# Patient Record
Sex: Male | Born: 1990 | Race: Black or African American | Hispanic: No | Marital: Single | Smoking: Current every day smoker
Health system: Southern US, Community
[De-identification: ages and names within clinical notes are randomized; demographics above are authoritative.]

## PROBLEM LIST (undated history)

## (undated) DIAGNOSIS — I10 Essential (primary) hypertension: Secondary | ICD-10-CM

---
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
## 2008-05-20 ENCOUNTER — Ambulatory Visit (HOSPITAL_BASED_OUTPATIENT_CLINIC_OR_DEPARTMENT_OTHER): Admission: RE | Admit: 2008-05-20 | Discharge: 2008-05-20 | Payer: Self-pay | Admitting: Internal Medicine

## 2008-05-23 ENCOUNTER — Ambulatory Visit: Payer: Self-pay | Admitting: Internal Medicine

## 2010-06-28 NOTE — Procedures (Signed)
NAME:  Raymond Warren, Raymond Warren                ACCOUNT NO.:  [id number]   MEDICAL RECORD NO.:  [id number]          PATIENT TYPE:  OUT   LOCATION:  SLEEP CENTER                 FACILITY:  [facility]   PHYSICIAN:  Clinton D. Maple Hudson, MD, FCCP, FACPDATE OF BIRTH:  06-23-88   DATE OF STUDY:  05/20/2008                            NOCTURNAL POLYSOMNOGRAM   REFERRING PHYSICIAN:  Fleet Contras, M.D.   INDICATIONS FOR STUDY:  Hypersomnia with sleep apnea.   EPWORTH SLEEPINESS SCORE:  Epworth sleepiness score 17/24.  BMI 46.4.  Weight 305 pounds.  Height 68 inches.  Neck 17.5 inches.   HOME MEDICATIONS:  Charted and reviewed.   SLEEP ARCHITECTURE:  Total sleep time 367 minutes with sleep efficiency  84.6%.  Stage I was 6.5%, stage II 70.4%, stage III absent, and REM 23%  of total sleep time.  Sleep latency 23 minutes, REM latency 68 minutes,  awake after sleep onset 44 minutes, and arousal index 18.6.  No bedtime  medication was taken.   RESPIRATORY DATA:  Apnea/hypopnea index (AHI) 3.1 per hour.  A total of  19 events was scored including 5 obstructive apneas and 14 hypopneas.  Events were all associated with supine sleep position.  REM AHI 6.4 per  hour.  An additional 17 respiratory events related to arousal, but not  meeting duration and oxygen desaturation criteria to be counted as  apneas or hypopneas contributed to an overall respiratory disturbance  index (RDI) 5.9 per hour.   OXYGEN DATA:  Loud snoring with oxygen desaturation to a nadir of 84%.  Mean oxygen saturation through the study was 95.4% on room air.   CARDIAC DATA:  Normal sinus rhythm.   MOVEMENT/PARASOMNIA:  No significant movement disturbance.  Bathroom x1.   IMPRESSION/RECOMMENDATIONS:  1. Occasional respiratory events with sleep disturbance, AHI 3.1 per      hour (normal range 0-5 per hour).  Additional respiratory events      related to arousal and rates the total respiratory disturbance      index to 5.9 per hour with  discussion as above.  This would be      minimal criteria for very mild obstructive sleep apnea/hypopnea      syndrome.  Events were associated with supine sleep position, very      loud snoring, and oxygen desaturation to a nadir of 84% on room      air.  2. Scores in this range are not usually addressed with CPAP therapy.      His events were positional and he may do      better with encouragement to sleep off flat of his back and perhaps      with treatment for nasal congestion or upper airway obstruction if      appropriate.      Clinton D. Maple Hudson, MD, [facility], FACP  Diplomate, Biomedical engineer of Sleep Medicine  Electronically Signed     CDY/MEDQ  D:  05/23/2008 12:11:38  T:  05/24/2008 00:22:38  Job:  [id number]

## 2011-01-08 ENCOUNTER — Emergency Department (HOSPITAL_COMMUNITY)
Admission: EM | Admit: 2011-01-08 | Discharge: 2011-01-08 | Disposition: A | Discharge status: Home / Self Care | Payer: Self-pay | Attending: Emergency Medicine | Admitting: Emergency Medicine

## 2011-01-08 ENCOUNTER — Encounter: Payer: Self-pay | Admitting: Emergency Medicine

## 2011-01-08 ENCOUNTER — Emergency Department (HOSPITAL_COMMUNITY): Payer: Self-pay

## 2011-01-08 DIAGNOSIS — R1011 Right upper quadrant pain: Secondary | ICD-10-CM | POA: Insufficient documentation

## 2011-01-08 DIAGNOSIS — X58XXXA Exposure to other specified factors, initial encounter: Secondary | ICD-10-CM | POA: Insufficient documentation

## 2011-01-08 DIAGNOSIS — F17200 Nicotine dependence, unspecified, uncomplicated: Secondary | ICD-10-CM | POA: Insufficient documentation

## 2011-01-08 DIAGNOSIS — K920 Hematemesis: Secondary | ICD-10-CM | POA: Insufficient documentation

## 2011-01-08 DIAGNOSIS — S2341XA Sprain of ribs, initial encounter: Secondary | ICD-10-CM | POA: Insufficient documentation

## 2011-01-08 LAB — DIFFERENTIAL
Basophils Absolute: 0 10*3/uL (ref 0.0–0.1)
Lymphocytes Relative: 30 % (ref 12–46)
Monocytes Absolute: 0.6 10*3/uL (ref 0.1–1.0)
Neutro Abs: 4.6 10*3/uL (ref 1.7–7.7)

## 2011-01-08 LAB — COMPREHENSIVE METABOLIC PANEL
ALT: 39 U/L (ref 0–53)
AST: 40 U/L — ABNORMAL HIGH (ref 0–37)
Alkaline Phosphatase: 73 U/L (ref 39–117)
CO2: 27 mEq/L (ref 19–32)
Chloride: 103 mEq/L (ref 96–112)
Creatinine, Ser: 1.03 mg/dL (ref 0.50–1.35)
GFR calc non Af Amer: >90 mL/min (ref >90)
Total Bilirubin: 0.5 mg/dL (ref 0.3–1.2)

## 2011-01-08 LAB — CBC
HCT: 44.1 % (ref 39.0–52.0)
Hemoglobin: 14.9 g/dL (ref 13.0–17.0)
RDW: 14 % (ref 11.5–15.5)
WBC: 7.9 10*3/uL (ref 4.0–10.5)

## 2011-01-08 NOTE — ED Notes (Signed)
Pt. Stated, i coughed real hard and rt. Side started hurting and it's still sore and today i threw up blood.

## 2011-01-08 NOTE — ED Provider Notes (Signed)
20yo M, c/o "vomiting dark blood" and well as passing "dark red stools" for the past several days.  Also c/o right sided abd pain that began several weeks ago "after coughing."  NAD, A&O, CTA, RRR, RUQ TTP.  Dx testing ordered. I saw this patient for screening purposes.  Pt is stable, but further workup and evaluation is needed beyond triage capabilities.   Laray Anger, DO 01/08/11 1719

## 2011-01-08 NOTE — ED Provider Notes (Signed)
History     CSN: [id number] Arrival date & time: 01/08/2011  2:21 PM   First MD Initiated Contact with Patient 01/08/11 1705      Chief Complaint  Patient presents with  . Abdominal Pain    vomited blood    (Consider location/radiation/quality/duration/timing/severity/associated sxs/prior treatment) HPI Comments: Patient states he coughed about 1 week ago and developed a sharp pain in his R side that has gradually gotten better. At first it was associated with a cough that has also gotten better, Today ate greasy eggs and vomited X1 several times in a row with the last episode notice blood streaks mixed with the food .  He is not nauseated, has no abdominal pain at this time, denies use of NSAIDS.  Pain is reproducible with deep palpitation,or certain twisting positions  not with deep breathing and is not SOB.  Patient is a 20 y.o. male presenting with abdominal pain. The history is provided by the patient.  Abdominal Pain The primary symptoms of the illness include abdominal pain, vomiting and hematemesis. The primary symptoms of the illness do not include fever, shortness of breath, nausea, diarrhea or dysuria. The current episode started 3 to 5 hours ago. The onset of the illness was sudden. The problem has been resolved.  The abdominal pain began more than 2 days ago. The pain came on suddenly. The abdominal pain is located in the RUQ and chest. The abdominal pain does not radiate. The severity of the abdominal pain is 2/10. The abdominal pain is relieved by nothing. The abdominal pain is exacerbated by certain positions.  The vomiting began today. Vomiting occurred once. The emesis contains stomach contents and bright red blood.  The initial episode of hematemesis occurred today. The hematemesis is a new problem. The hematemesis is not associated with heartburn, weakness or dizziness.  The patient has not had a change in bowel habit. Symptoms associated with the illness do not include  chills, anorexia, diaphoresis, heartburn, constipation, hematuria or back pain. Significant associated medical issues do not include GERD, inflammatory bowel disease, gallstones, liver disease or substance abuse.    History reviewed. No pertinent past medical history.  History reviewed. No pertinent past surgical history.  History reviewed. No pertinent family history.  History  Substance Use Topics  . Smoking status: Current Everyday Smoker -- 1.0 packs/day  . Smokeless tobacco: Not on file  . Alcohol Use: 2.4 oz/week    4 Cans of beer per week      Review of Systems  Constitutional: Negative for fever, chills and diaphoresis.  HENT: Negative.   Eyes: Negative.   Respiratory: Negative for shortness of breath.   Cardiovascular: Negative for chest pain, palpitations and leg swelling.  Gastrointestinal: Positive for vomiting, abdominal pain and hematemesis. Negative for heartburn, nausea, diarrhea, constipation, blood in stool, abdominal distention, anal bleeding, rectal pain and anorexia.  Genitourinary: Negative for dysuria and hematuria.  Musculoskeletal: Negative for myalgias and back pain.  Skin: Negative for color change.  Neurological: Negative for dizziness and weakness.  Psychiatric/Behavioral: Negative.     Allergies  Review of patient's allergies indicates no known allergies.  Home Medications  No current outpatient prescriptions on file.  BP 144/93  Pulse 72  Temp(Src) 98.6 F (37 C) (Oral)  Resp 17  SpO2 98%  Physical Exam  Constitutional: He is oriented to person, place, and time. He appears well-developed and well-nourished.  HENT:  Head: Normocephalic.  Mouth/Throat: Uvula is midline. No oropharyngeal exudate, posterior oropharyngeal edema, posterior  oropharyngeal erythema or tonsillar abscesses.  Neck: Normal range of motion.  Cardiovascular: Normal rate.   Pulmonary/Chest: No respiratory distress. He has no wheezes. He has no rales. He exhibits  tenderness.       At edge of R lower ribs minimally tender   Abdominal: Soft. Bowel sounds are normal. He exhibits no distension and no mass. There is tenderness. There is no rebound and no guarding.         Ay edge of last rib tender with deep palpation  Genitourinary: Rectum normal. Rectal exam shows no external hemorrhoid, no internal hemorrhoid, no fissure, no tenderness and anal tone normal. Guaiac negative stool.  Musculoskeletal: Normal range of motion.  Neurological: He is oriented to person, place, and time.  Skin: Skin is warm and dry.    ED Course  Procedures (including critical care time)  Labs Reviewed  COMPREHENSIVE METABOLIC PANEL - Abnormal; Notable for the following:    AST 40 (*)    All other components within normal limits  CBC  DIFFERENTIAL  LIPASE, BLOOD  OCCULT BLOOD, POC DEVICE  POCT OCCULT BLOOD STOOL, DEVICE   Dg Abd Acute W/chest  01/08/2011  *RADIOLOGY REPORT*  Clinical Data: Small bowel obstruction.  Free air.  ACUTE ABDOMEN SERIES (ABDOMEN 2 VIEW & CHEST 1 VIEW)  Comparison: None.  Findings:  Cardiopericardial silhouette within normal limits. Mediastinal contours normal. Trachea midline.  No airspace disease or effusion.  No free air underneath the hemidiaphragms.  Normal bowel gas pattern.  No dilated loops of large or small bowel. Stool and bowel gas extends to the rectosigmoid.  IMPRESSION: Normal exam.  Original Report Authenticated By: Andreas Newport, M.D.     1. Rib sprain     8:30 PM Discussed labs xray rectal exam findings with patient He will follow up with Dr. Claudie Revering tomorrow to reestablish PCP MDM  Suspect muscle strain, will obtain and review labs  Chest xray, rectal exam          Arman Filter, NP 01/08/11 2030

## 2011-01-09 NOTE — ED Provider Notes (Signed)
Medical screening examination/treatment/procedure(s) were performed by non-physician practitioner and as supervising physician I was immediately available for consultation/collaboration.   Oretta Berkland R. Niyam Bisping, MD 01/09/11 0015 

## 2013-07-10 ENCOUNTER — Emergency Department (HOSPITAL_COMMUNITY)
Admission: EM | Admit: 2013-07-10 | Discharge: 2013-07-10 | Disposition: A | Discharge status: Home / Self Care | Payer: Self-pay | Attending: Emergency Medicine | Admitting: Emergency Medicine

## 2013-07-10 ENCOUNTER — Encounter (HOSPITAL_COMMUNITY): Payer: Self-pay | Admitting: Emergency Medicine

## 2013-07-10 DIAGNOSIS — E663 Overweight: Secondary | ICD-10-CM | POA: Insufficient documentation

## 2013-07-10 DIAGNOSIS — F17200 Nicotine dependence, unspecified, uncomplicated: Secondary | ICD-10-CM | POA: Insufficient documentation

## 2013-07-10 DIAGNOSIS — I10 Essential (primary) hypertension: Secondary | ICD-10-CM

## 2013-07-10 HISTORY — DX: Essential (primary) hypertension: I10

## 2013-07-10 LAB — I-STAT CHEM 8, ED
BUN: 12 mg/dL (ref 6–23)
CHLORIDE: 103 meq/L (ref 96–112)
Calcium, Ion: 1.24 mmol/L — ABNORMAL HIGH (ref 1.12–1.23)
Creatinine, Ser: 1.1 mg/dL (ref 0.50–1.35)
Glucose, Bld: 83 mg/dL (ref 70–99)
HEMATOCRIT: 47 % (ref 39.0–52.0)
HEMOGLOBIN: 16 g/dL (ref 13.0–17.0)
POTASSIUM: 3.8 meq/L (ref 3.7–5.3)
SODIUM: 144 meq/L (ref 137–147)
TCO2: 27 mmol/L (ref 0–100)

## 2013-07-10 MED ORDER — HYDROCHLOROTHIAZIDE 12.5 MG PO TABS: ORAL_TABLET | ORAL | Status: AC

## 2013-07-10 NOTE — ED Notes (Signed)
Per pt, states he has not taken BP meds since 2008-

## 2013-07-10 NOTE — ED Provider Notes (Signed)
CSN: [id number]     Arrival date & time 07/10/13  1504 History  This chart was scribed for Rhea Bleacher PA-C  working with Rolland Porter, MD by Ashley Jacobs, ED scribe. This patient was seen in room WTR8/WTR8 and the patient's care was started at 3:44 PM.  First MD Initiated Contact with Patient 07/10/13 1530     Chief Complaint  Patient presents with  . Hypertension     (Consider location/radiation/quality/duration/timing/severity/associated sxs/prior Treatment) HPI HPI Comments: Raymond Warren is a 23 y.o. male who presents to the Emergency Department complaining of HTN. Pt reports that he ran out of his HTN medication 2008. Pt had recent BP readings of 160-175 systolic which made him want to get checked. He has headaches, SOB, trouble breathing and chest pain when his BP is elevated. He reports that he does not have any of those symptoms currently. Denies weakness, numbness and trouble walking.  Denies any difficulty urinating. He had a hx of HTN since he was an adolescent.    Pt does not have a current PCP.  Past Medical History  Diagnosis Date  . Hypertension    History reviewed. No pertinent past surgical history. No family history on file. History  Substance Use Topics  . Smoking status: Current Every Day Smoker -- 1.00 packs/day  . Smokeless tobacco: Not on file  . Alcohol Use: 2.4 oz/week    4 Cans of beer per week    Review of Systems  Eyes: Negative for visual disturbance.  Respiratory: Negative for shortness of breath.   Cardiovascular: Negative for chest pain.  Genitourinary: Negative for decreased urine volume and difficulty urinating.  Musculoskeletal: Negative for gait problem.  Neurological: Negative for weakness, numbness and headaches.      Allergies  Review of patient's allergies indicates no known allergies.  Home Medications   Prior to Admission medications   Not on File   BP 165/89  Pulse 86  Temp(Src) 98.4 F (36.9 C) (Oral)  Resp 20  SpO2  99% Physical Exam  Nursing note and vitals reviewed. Constitutional: He appears well-developed and well-nourished.  overweight  HENT:  Head: Normocephalic and atraumatic.  Mouth/Throat: Oropharynx is clear and moist.  Eyes: Conjunctivae are normal. Pupils are equal, round, and reactive to light. Right eye exhibits no discharge. Left eye exhibits no discharge.  Neck: Normal range of motion. Neck supple.  Cardiovascular: Normal rate, regular rhythm and normal heart sounds.   Pulmonary/Chest: Effort normal and breath sounds normal.  Abdominal: Soft. There is no tenderness. There is no rebound and no guarding.  Neurological: He is alert.  Skin: Skin is warm and dry.  Psychiatric: He has a normal mood and affect.    ED Course  Procedures (including critical care time) DIAGNOSTIC STUDIES: Oxygen Saturation is 99% on room air, normal by my interpretation.    COORDINATION OF CARE:  3:47 PM Discussed course of care with pt which includes laboratory tests. Pt understands and agrees.   Labs Review Labs Reviewed  I-STAT CHEM 8, ED - Abnormal; Notable for the following:    Calcium, Ion 1.24 (*)    All other components within normal limits    Imaging Review No results found.   EKG Interpretation None      Vital signs reviewed and are as follows: Filed Vitals:   07/10/13 1511  BP: 165/89  Pulse: 86  Temp: 98.4 F (36.9 C)  Resp: 20   Creatinine is normal. Will start on HCTZ. Patient counseled on titration  and its use.  Stressed followup with PCP for titration and management of his blood pressure. Patient given referral to [facility].  MDM   Final diagnoses:  Hypertension   Patient with elevated blood pressure, no signs of end organ damage. He does not have any current symptoms. Will start on antihypertensive. Creatinine checked and is normal. Do not feel further workup is indicated at this time. PCP referrals given.  I personally performed the services described in  this documentation, which was scribed in my presence. The recorded information has been reviewed and is accurate.    Raymond CriglerJoshua Khalea Ventura, PA-C 07/10/13 1624

## 2013-07-10 NOTE — Discharge Instructions (Signed)
Please read and follow all provided instructions.  Your diagnoses today include:  1. Hypertension     Your blood pressure was high today (BP): BP 165/89   Pulse 86   Temp(Src) 98.4 F (36.9 C) (Oral)   Resp 20   SpO2 99%  Tests performed today include:  Vital signs. See below for your results today.   Medications prescribed:   HCTZ - medication for blood pressure  Home care instructions:  Follow any educational materials contained in this packet.  Follow-up instructions: Please follow-up with your primary care provider in the next 7 days for a recheck of your symptoms and blood pressure.    If you do not have a primary care doctor -- see below for referral information.   Return instructions:   Please return to the Emergency Department if you experience worsening symptoms.   Return with severe chest pain, abdominal pain, or shortness of breath.   Return with severe headache, focal weakness, numbness, difficulty with speech or vision.  Return with loss of vision or sudden vision change.  Please return if you have any other emergent concerns.  Additional Information:  Your vital signs today were: BP 165/89   Pulse 86   Temp(Src) 98.4 F (36.9 C) (Oral)   Resp 20   SpO2 99% If your blood pressure (BP) was elevated above 135/85 this visit, please have this repeated by your doctor within one month. --------------  Emergency Department Resource Guide 1) Find a Doctor and Pay Out of Pocket Although you won't have to find out who is covered by your insurance plan, it is a good idea to ask around and get recommendations. You will then need to call the office and see if the doctor you have chosen will accept you as a new patient and what types of options they offer for patients who are self-pay. Some doctors offer discounts or will set up payment plans for their patients who do not have insurance, but you will need to ask so you aren't surprised when you get to your  appointment.  2) Contact Your Local Health Department Not all health departments have doctors that can see patients for sick visits, but many do, so it is worth a call to see if yours does. If you don't know where your local health department is, you can check in your phone book. The CDC also has a tool to help you locate your state's health department, and many state websites also have listings of all of their local health departments.  3) Find a Walk-in Clinic If your illness is not likely to be very severe or complicated, you may want to try a walk in clinic. These are popping up all over the country in pharmacies, drugstores, and shopping centers. They're usually staffed by nurse practitioners or physician assistants that have been trained to treat common illnesses and complaints. They're usually fairly quick and inexpensive. However, if you have serious medical issues or chronic medical problems, these are probably not your best option.  No Primary Care Doctor: - Call Health Connect at  [phone] - they can help you locate a primary care doctor that  accepts your insurance, provides certain services, etc. - Physician Referral Service- [phone]  Chronic Pain Problems: Organization         Address  Phone   Notes  [facility]  [phone] Patients need to be referred by their primary care doctor.   Medication Assistance: Organization  Address  Phone   Notes  [facility] Medication [facility] [address] [place], [place] [address] [phone] --Must be a resident of [facility] -- Must have NO insurance coverage whatsoever (no Medicaid/ Medicare, etc.) -- The pt. MUST have a primary care doctor that directs their care regularly and follows them in the community   [facility]ist  [phone]   [employer]  [phone]    Agencies that provide inexpensive medical care: Organization         Address  Phone   Notes  [place]  [phone]   [facility]    [phone]   [facility] [place], [place] [address] [phone]   [place] [address], [place] [phone]   Planned Parenthood    [phone]   [facility]    [phone]   [place] and [place] [address], [place] Phone:  [phone], Fax:  [phone] Hours of Operation:  9 am - 6 pm, M-F.  Also accepts Medicaid/Medicare and self-pay.  [facility] for [place], [address], [place] Phone: [phone], Fax: [phone]. Hours of Operation:  8:30 am - 5:30 pm, M-F.  Also accepts Medicaid and self-pay.  [facility] [place] [address], [place] Phone: [phone]   [place], [place], [place] [phone], Ext. [phone] Mondays & Thursdays: 7-9 AM.  First 15 patients are seen on a first come, first serve basis.    [place] Providers:  Organization         Address  Phone   Notes  [facility] [address], [place] [phone] Also accepts self-pay patients.  [facility] [address], [place]  [phone]   [facility], [address], [place] [phone]   [facility] [address], [place] [phone]   Lucianne Lei [address], [place]   [phone] Only accepts [place] Access [place] patients after they have their name applied to their card.   Self-Pay (no insurance) in [facility]:  Organization         Address  Phone   Notes  Sickle Cell Patients, [facility] [place] [phone]   [facility] [place] [phone]   [facility]  [place]  [place], [place], [place] [phone]   [facility]/Dr. Osei-Bonsu  [address], [place] or [address], [place] [phone] Phone number for both [place] and [place] locations is the same.  Urgent Medical and [facility] [address] [place] [phone]   [facility] [address] [place] or [address] [phone]   [facility] [address], [place] [phone], phone; [phone], fax Sees patients 1st and 3rd Saturday of every month.  Must not qualify for public or private insurance (i.e. Medicaid, Medicare, [place] Health Choice, Veterans' Benefits)  Household income should be no more than 200% of the poverty level The clinic cannot treat you if you are pregnant or think you  are pregnant  Sexually transmitted diseases are not treated at the clinic.    Dental Care: Organization         Address  Phone  Notes  [facility] Department of [facility] [place] [phone] Accepts children up to age 77 who are enrolled in [place] or [place]; pregnant women with a Medicaid card; and children who have applied for Medicaid or [place] Health Choice, but were declined, whose parents can pay a reduced fee at time of service.  [facility] Department of [facility]  [address], [place] [phone] Accepts children up to age 65 who are enrolled in [place] or [place]; pregnant women with a Medicaid card; and children who have applied for Medicaid or [place] Health Choice, but were declined, whose parents can pay a reduced fee at time of service.  [place] Adult [facility] Access PROGRAM  [place] [phone] Patients are seen by appointment only. Walk-ins are not accepted. [place] will see patients 53 years of age and  older. Monday - Tuesday (8am-5pm) Most Wednesdays (8:30-5pm) $30 per visit, cash only  [facility] Adult Dental Access PROGRAM  [address], [facility] [phone] Patients are seen by appointment only. Walk-ins are not accepted. [place] will see patients 33 years of age and older. One Wednesday Evening (Monthly: Volunteer Based).  $30 per visit, cash only  [place]  [phone] for adults; Children under age 19, call Graduate Pediatric Dentistry at [phone]. Children aged 18-14, please call [phone] to request a pediatric application.  Dental services are provided in all areas of dental care including fillings, crowns and bridges, complete and partial dentures, implants, gum treatment, root canals, and extractions. Preventive care is also provided. Treatment is provided to both adults and children. Patients are selected via a lottery and there is often a waiting list.   [facility] [address], [place]  [phone] [email or web address]   [facility] [address] [place], [place] [phone], Ext. [phone] Second and Fourth Thursday of each month, opens at 6:30 AM; Clinic ends at 9 AM.  Patients are seen on a first-come first-served basis, and a limited number are seen during each clinic.   [facility]  [address] Hillard Danker [place], [place] [phone]   Eligibility Requirements You must have lived in [place], [place], or [place] counties for at least the last three months.   You cannot be eligible for state or federal sponsored [employer], including [employer], [place], or [employer].   You generally cannot be eligible for healthcare insurance through your employer.    How to apply: Eligibility screenings are held every Tuesday and Wednesday afternoon from 1:00 pm until 4:00 pm. You do not need an appointment for the interview!  [facility] [address]  [place], [place]   [place]  [place] Department  [place]  [phone]    Behavioral Health Resources in the Community: Intensive Outpatient Programs Organization         Address  Phone  Notes  [place]. [address] [place], [place] [phone]   [facility] Outpatient [address] [place], [place]   ADS: Alcohol & Drug Svcs [address], [place], [place]   [place] [address]  [place], [place] or [phone]   Substance Abuse Resources Organization         Address  Phone  Notes  Alcohol and Drug Services  [phone]   [place]  [phone]   [facility] [place]   Chinita Pester  [phone]   Residential & Outpatient Substance Abuse Program  [phone]   Psychological Services Organization         Address  Phone  Notes  [facility] [place]  [place]  [phone]   [place] [address], [place] or [phone]    Mobile Crisis Teams Organization         Address  Phone  Notes  [facility], Mobile Crisis Care Unit  [phone]   [facility]  [address] [place], [place]   [place] [address], [place] [phone]    Self-Help/Support Groups Organization         Address  Phone             Notes  [place]. of [place] - variety of support groups  [place] Call for more information  Narcotics Anonymous (NA), [facility] [address], [employer] [place]  2 meetings at this location   Special educational needs teacher         Address  Phone  Notes  [facility] Residential Treatment [place],    [place]  [phone]   [facility]  [address], [place] [address], [place], [place]   [place], [place] [phone] Admissions: 8am-3pm M-F  Incentives Substance [place] [address],    [place], [place] [phone]   [facility] [address] [place], [place], [place]   [facility] [address],  [place], [place]   [facility] Programs - Intensive Outpatient [address], [place], [place]   [facility] ([place].,  [place], [place] [phone] or [phone]   [facility] Services (RTS) [address] [place], [place] Accepts Medicaid  [facility] [place] [address]  [place] [phone] Substance Abuse/Addiction Treatment   [facility] Organization         Address  Phone  Notes  [facility]  [phone]   Domenic Schwab, PhD [address] Arlis Porta [place], [place]   [phone] or [phone]   [facility], [place] [phone]   [facility] [address] [place], [place] [phone] Insurance/Medicaid/sponsorship through [facility] and Families [address] [place]                                    [place], [place] [phone] [place] [address], [place] [phone]    Dr. Adele Schilder  [phone]   [facility] [place] Dept. 1) [address] [place] 2) [place] 3)  [address], [place] [phone]  [phone]   [place] [phone])  [phone] or [phone] (After Hours)

## 2013-07-14 NOTE — ED Provider Notes (Signed)
Medical screening examination/treatment/procedure(s) were performed by non-physician practitioner and as supervising physician I was immediately available for consultation/collaboration.   EKG Interpretation None        Cordella Nyquist, MD 07/14/13 1623 

## 2013-09-23 ENCOUNTER — Encounter (HOSPITAL_COMMUNITY): Payer: Self-pay | Admitting: Emergency Medicine

## 2013-09-23 ENCOUNTER — Emergency Department (HOSPITAL_COMMUNITY)
Admission: EM | Admit: 2013-09-23 | Discharge: 2013-09-23 | Disposition: A | Discharge status: Home / Self Care | Payer: Self-pay | Attending: Emergency Medicine | Admitting: Emergency Medicine

## 2013-09-23 ENCOUNTER — Emergency Department (HOSPITAL_COMMUNITY): Payer: Self-pay

## 2013-09-23 DIAGNOSIS — F17200 Nicotine dependence, unspecified, uncomplicated: Secondary | ICD-10-CM | POA: Insufficient documentation

## 2013-09-23 DIAGNOSIS — Z79899 Other long term (current) drug therapy: Secondary | ICD-10-CM | POA: Insufficient documentation

## 2013-09-23 DIAGNOSIS — R51 Headache: Secondary | ICD-10-CM | POA: Insufficient documentation

## 2013-09-23 DIAGNOSIS — I10 Essential (primary) hypertension: Secondary | ICD-10-CM | POA: Insufficient documentation

## 2013-09-23 LAB — CBC
HEMATOCRIT: 40.5 % (ref 39.0–52.0)
HEMOGLOBIN: 13.5 g/dL (ref 13.0–17.0)
MCH: 28.8 pg (ref 26.0–34.0)
MCHC: 33.3 g/dL (ref 30.0–36.0)
MCV: 86.5 fL (ref 78.0–100.0)
Platelets: 256 10*3/uL (ref 150–400)
RBC: 4.68 MIL/uL (ref 4.22–5.81)
RDW: 14.5 % (ref 11.5–15.5)
WBC: 7.7 10*3/uL (ref 4.0–10.5)

## 2013-09-23 LAB — COMPREHENSIVE METABOLIC PANEL
ALK PHOS: 59 U/L (ref 39–117)
ALT: 33 U/L (ref 0–53)
AST: 34 U/L (ref 0–37)
Albumin: 3.9 g/dL (ref 3.5–5.2)
Anion gap: 12 (ref 5–15)
BILIRUBIN TOTAL: 0.5 mg/dL (ref 0.3–1.2)
BUN: 14 mg/dL (ref 6–23)
CHLORIDE: 105 meq/L (ref 96–112)
CO2: 24 meq/L (ref 19–32)
Calcium: 9.4 mg/dL (ref 8.4–10.5)
Creatinine, Ser: 1.1 mg/dL (ref 0.50–1.35)
GFR calc non Af Amer: >90 mL/min (ref >90)
GLUCOSE: 81 mg/dL (ref 70–99)
POTASSIUM: 4.3 meq/L (ref 3.7–5.3)
SODIUM: 141 meq/L (ref 137–147)
TOTAL PROTEIN: 6.9 g/dL (ref 6.0–8.3)

## 2013-09-23 LAB — TROPONIN I: Troponin I: <0.3 ng/mL (ref <0.30)

## 2013-09-23 MED ORDER — HYDROCHLOROTHIAZIDE 25 MG PO TABS: 12.5000 mg | ORAL_TABLET | Freq: Every day | ORAL | Status: AC

## 2013-09-23 NOTE — ED Notes (Signed)
Patient went to donate plasma today and was informed that his blood pressure was too high, 194/114. Patient was sent to the ED directly from the donation center.  Patient reports a history of hypertension. Has taken BP medications in the past but is not on any medications now. Patient states that he moved and stopped seeing his MD.

## 2013-09-23 NOTE — ED Notes (Signed)
Patient placed on a heart monitor.

## 2013-09-23 NOTE — ED Notes (Signed)
Patient denies dizziness, blurred vision but does state that he had some numbness in both arms.

## 2013-09-23 NOTE — ED Notes (Addendum)
hpt reports hx of htn and has been off bp meds x 5 yrs, bp has been high when going to donate plasma but denies any symptoms. bp 165/874 at triage, no acute distress noted.

## 2013-09-23 NOTE — ED Provider Notes (Signed)
CSN: [id number]     Arrival date & time 09/23/13  1752 History  This chart was scribed for non-physician practitioner, Raymon Mutton, PA-C,working with Raeford Razor, MD, by Karle Plumber, ED Scribe. This patient was seen in room TR06C/TR06C and the patient's care was started at 8:23 PM.  Chief Complaint  Patient presents with  . Hypertension   Patient is a 23 y.o. male presenting with hypertension. The history is provided by the patient. No language interpreter was used.  Hypertension Associated symptoms include headaches. Pertinent negatives include no chest pain and no shortness of breath.   HPI Comments:  Raymond Warren is a 23 y.o. obese male with h/o HTN who presents to the Emergency Department complaining of an elevated blood pressure that was detected approximately two hours ago when he presented to donate plasma at the plasma center. It was noted that his BP was 194/114 and pt was told he would not be able to donate and was came directly here to the ED. Pt endorses severe HA earlier today that has since resolved and had a tingling sensation in his fingers bilaterally. Pt states he has intermittent centralized and left-sided aching CP with associated clamminess that last approximately 10 minutes at a time for the past couple of years - reported that the pain comes after eating. He states he used to take medications for his BP, HCTZ, but stopped taking them three years ago. He states he has not had a PCP in 4 years. He denies SOB, numbness, weakness, dizziness, nausea, vomiting, blurred vision or loss of vision, current CP or CP today and current HA. Pt states he was first diagnosed with HTN is high school.  Past Medical History  Diagnosis Date  . Hypertension    History reviewed. No pertinent past surgical history. History reviewed. No pertinent family history. History  Substance Use Topics  . Smoking status: Current Every Day Smoker -- 1.00 packs/day  . Smokeless tobacco: Not on  file  . Alcohol Use: 2.4 oz/week    4 Cans of beer per week    Review of Systems  Constitutional: Negative for fever and appetite change.  Eyes: Negative for visual disturbance.  Respiratory: Negative for shortness of breath.   Cardiovascular: Negative for chest pain.       HTN  Gastrointestinal: Negative for nausea and vomiting.  Neurological: Positive for headaches.    Allergies  Review of patient's allergies indicates no known allergies.  Home Medications   Prior to Admission medications   Medication Sig Start Date End Date Taking? Authorizing Provider  hydrochlorothiazide (HYDRODIURIL) 12.5 MG tablet Take 12.5mg  PO daily for 7 days, then take 25mg  PO daily 07/10/13   Renne Crigler, PA-C  hydrochlorothiazide (HYDRODIURIL) 25 MG tablet Take 0.5 tablets (12.5 mg total) by mouth daily. 09/23/13   Libra Gatz, PA-C   Triage Vitals: BP 165/84  Pulse 73  Temp(Src) 98.2 F (36.8 C) (Oral)  Resp 20  SpO2 97% Physical Exam  Nursing note and vitals reviewed. Constitutional: He is oriented to person, place, and time. He appears well-developed and well-nourished.  HENT:  Head: Normocephalic and atraumatic.  Mouth/Throat: Oropharynx is clear and moist. No oropharyngeal exudate.  Eyes: Conjunctivae and EOM are normal. Pupils are equal, round, and reactive to light. Right eye exhibits no discharge. Left eye exhibits no discharge.  Visual fields grossly intact Negative nystagmus  Neck: Normal range of motion. Neck supple. No tracheal deviation present.  Negative neck stiffness Negative nuchal rigidity  Negative cervical lymphadenopathy  Negative meningeal signs  Cardiovascular: Normal rate, regular rhythm and normal heart sounds.   Pulses:      Radial pulses are 2+ on the right side, and 2+ on the left side.  Cap refill < 3 seconds  Pulmonary/Chest: Effort normal and breath sounds normal. No respiratory distress. He has no wheezes. He has no rales. He exhibits no tenderness.   Abdominal:  Obese   Musculoskeletal: Normal range of motion. He exhibits no edema and no tenderness.  Full ROM to upper and lower extremities without difficulty noted, negative ataxia noted.  Lymphadenopathy:    He has no cervical adenopathy.  Neurological: He is alert and oriented to person, place, and time. No cranial nerve deficit. He exhibits normal muscle tone. Coordination normal.  Cranial nerves III-XII grossly intact Strength 5+/5+ to upper and lower extremities bilaterally with resistance applied, equal distribution noted Equal grip strength bilaterally Sensation intact Negative facial drooping Negative slurred speech Negative aphasia  Skin: Skin is warm and dry. No rash noted. No erythema.  Psychiatric: He has a normal mood and affect. His behavior is normal.    ED Course  Procedures (including critical care time) DIAGNOSTIC STUDIES: Oxygen Saturation is 97% on RA, normal by my interpretation.   COORDINATION OF CARE: 8:29 PM- Will order lab work and CT head. Pt verbalizes understanding and agrees to plan.  Medications - No data to display  Results for orders placed during the hospital encounter of 09/23/13  CBC      Result Value Ref Range   WBC 7.7  4.0 - 10.5 K/uL   RBC 4.68  4.22 - 5.81 MIL/uL   Hemoglobin 13.5  13.0 - 17.0 g/dL   HCT 91.440.5  78.239.0 - 95.652.0 %   MCV 86.5  78.0 - 100.0 fL   MCH 28.8  26.0 - 34.0 pg   MCHC 33.3  30.0 - 36.0 g/dL   RDW 21.314.5  08.611.5 - 57.815.5 %   Platelets 256  150 - 400 K/uL  COMPREHENSIVE METABOLIC PANEL      Result Value Ref Range   Sodium 141  137 - 147 mEq/L   Potassium 4.3  3.7 - 5.3 mEq/L   Chloride 105  96 - 112 mEq/L   CO2 24  19 - 32 mEq/L   Glucose, Bld 81  70 - 99 mg/dL   BUN 14  6 - 23 mg/dL   Creatinine, Ser 4.691.10  0.50 - 1.35 mg/dL   Calcium 9.4  8.4 - 62.910.5 mg/dL   Total Protein 6.9  6.0 - 8.3 g/dL   Albumin 3.9  3.5 - 5.2 g/dL   AST 34  0 - 37 U/L   ALT 33  0 - 53 U/L   Alkaline Phosphatase 59  39 - 117 U/L   Total  Bilirubin 0.5  0.3 - 1.2 mg/dL   GFR calc non Af Amer >90  >90 mL/min   GFR calc Af Amer >90  >90 mL/min   Anion gap 12  5 - 15  TROPONIN I      Result Value Ref Range   Troponin I <0.30  <0.30 ng/mL    Labs Review Labs Reviewed  CBC  COMPREHENSIVE METABOLIC PANEL  TROPONIN I    Imaging Review Dg Chest 2 View  09/23/2013   CLINICAL DATA:  Hypertensive episode ; discontinued antihypertensive  EXAM: CHEST  2 VIEW  COMPARISON:  Chest X ray dated January 08, 2011  FINDINGS: The lungs are adequately inflated. There is  no focal infiltrate. The cardiac silhouette is top-normal in size. The central pulmonary vascularity is prominent but not greatly changed. There is no pleural effusion. The bony thorax exhibits no acute abnormality.  IMPRESSION: There is no evidence of acute cardiopulmonary abnormality.   Electronically Signed   By: David  [place]   On: 09/23/2013 21:20   Ct Head Wo Contrast  09/23/2013   CLINICAL DATA:  Headache; hypertension  EXAM: CT HEAD WITHOUT CONTRAST  TECHNIQUE: Contiguous axial images were obtained from the base of the skull through the vertex without intravenous contrast.  COMPARISON:  None.  FINDINGS: The ventricles are normal in size and configuration. There is no mass, hemorrhage, extra-axial fluid collection, or midline shift. Gray-white compartments appear normal. Bony calvarium appears intact. The mastoid air cells are clear. 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 No intracranial mass, hemorrhage, or acute appearing infarct.   Electronically Signed   By: Bretta Bang M.D.   On: 09/23/2013 21:15     EKG Interpretation None      Date: 09/23/2013  Rate: 60  Rhythm: normal sinus rhythm  QRS Axis: normal  Intervals: normal  ST/T Wave abnormalities: normal  Conduction Disutrbances:none  Narrative Interpretation:   Old EKG Reviewed: none  available EKG analyzed and reviewed by this provider and attending physician.     MDM   Final diagnoses:  Essential hypertension    Filed Vitals:   09/23/13 1808 09/23/13 2200 09/23/13 2230  BP: 165/84 177/111 170/111  Pulse: 73 68 64  Temp: 98.2 F (36.8 C)    TempSrc: Oral    Resp: 20 12 17   SpO2: 97% 100%    I personally performed the services described in this documentation, which was scribed in my presence. The recorded information has been reviewed and is accurate.  EKG noted normal sinus rhythm-EKG normal. Troponin negative elevation. Labs unremarkable. Chest x-ray negative for acute cardiopulmonary disease. CT head noted benign osteoma the left anterior ethmoid complex-negative findings of acute intracranial abnormalities. Negative findings of end organ damage. HEART score of 2. Discussed case in great detail with attending physician who agreed to plan of discharge. As per physician recommended patient be started on antihypertensives again. Patient stable, afebrile. Patient not septic appearing. Patient asymptomatic. This appears to be a chronic issue-patient has history of hypertension with no medications for the past 2 years. Patient discharged with hydrochlorothiazide. Recommended patient to have potassium and blood pressure rechecked within approximately 3 days. Discussed with patient to rest and stay hydrated-discussed diet decreased salt intake. Discussed with patient to closely monitor symptoms and if symptoms are to worsen or change to report back to the ED - strict return instructions given.  Patient agreed to plan of care, understood, all questions answered.   Raymon Mutton, PA-C 09/24/13 1332

## 2013-09-23 NOTE — Discharge Instructions (Signed)
Please call your doctor for a followup appointment within 24-48 hours. When you talk to your doctor please let them know that you were seen in the emergency department and have them acquire all of your records so that they can discuss the findings with you and formulate a treatment plan to fully care for your new and ongoing problems. Please call and set-up an appointment with your primary care provider Please rest and stay hydrated-please drink plenty of water Please follow the DASH diet for blood pressure control Please take medications as prescribed Please have potassium and blood pressure rechecked in approximately 3-5 days Please continue to monitor symptoms closely and if symptoms are to worsen or change (fever greater than 101, chills, headache, dizziness, blurred vision, sudden loss of vision, neck pain, chest pain, shortness of breath, difficulty breathing, numbness, tingling, loss of sensation) please report back to the ED immediately   Hypertension Hypertension, commonly called high blood pressure, is when the force of blood pumping through your arteries is too strong. Your arteries are the blood vessels that carry blood from your heart throughout your body. A blood pressure reading consists of a higher number over a lower number, such as 110/72. The higher number (systolic) is the pressure inside your arteries when your heart pumps. The lower number (diastolic) is the pressure inside your arteries when your heart relaxes. Ideally you want your blood pressure below 120/80. Hypertension forces your heart to work harder to pump blood. Your arteries may become narrow or stiff. Having hypertension puts you at risk for heart disease, stroke, and other problems.  RISK FACTORS Some risk factors for high blood pressure are controllable. Others are not.  Risk factors you cannot control include:   Race. You may be at higher risk if you are African American.  Age. Risk increases with  age.  Gender. Men are at higher risk than women before age 38 years. After age 48, women are at higher risk than men. Risk factors you can control include:  Not getting enough exercise or physical activity.  Being overweight.  Getting too much fat, sugar, calories, or salt in your diet.  Drinking too much alcohol. SIGNS AND SYMPTOMS Hypertension does not usually cause signs or symptoms. Extremely high blood pressure (hypertensive crisis) may cause headache, anxiety, shortness of breath, and nosebleed. DIAGNOSIS  To check if you have hypertension, your health care provider will measure your blood pressure while you are seated, with your arm held at the level of your heart. It should be measured at least twice using the same arm. Certain conditions can cause a difference in blood pressure between your right and left arms. A blood pressure reading that is higher than normal on one occasion does not mean that you need treatment. If one blood pressure reading is high, ask your health care provider about having it checked again. TREATMENT  Treating high blood pressure includes making lifestyle changes and possibly taking medicine. Living a healthy lifestyle can help lower high blood pressure. You may need to change some of your habits. Lifestyle changes may include:  Following the DASH diet. This diet is high in fruits, vegetables, and whole grains. It is low in salt, red meat, and added sugars.  Getting at least 2 hours of brisk physical activity every week.  Losing weight if necessary.  Not smoking.  Limiting alcoholic beverages.  Learning ways to reduce stress. If lifestyle changes are not enough to get your blood pressure under control, your health care provider  may prescribe medicine. You may need to take more than one. Work closely with your health care provider to understand the risks and benefits. HOME CARE INSTRUCTIONS  Have your blood pressure rechecked as directed by your  health care provider.   Take medicines only as directed by your health care provider. Follow the directions carefully. Blood pressure medicines must be taken as prescribed. The medicine does not work as well when you skip doses. Skipping doses also puts you at risk for problems.   Do not smoke.   Monitor your blood pressure at home as directed by your health care provider. SEEK MEDICAL CARE IF:   You think you are having a reaction to medicines taken.  You have recurrent headaches or feel dizzy.  You have swelling in your ankles.  You have trouble with your vision. SEEK IMMEDIATE MEDICAL CARE IF:  You develop a severe headache or confusion.  You have unusual weakness, numbness, or feel faint.  You have severe chest or abdominal pain.  You vomit repeatedly.  You have trouble breathing. MAKE SURE YOU:   Understand these instructions.  Will watch your condition.  Will get help right away if you are not doing well or get worse. Document Released: 01/30/2005 Document Revised: 06/16/2013 Document Reviewed: 11/22/2012 [facility] Patient Information 2015 [place], [place]. This information is not intended to replace advice given to you by your health care provider. Make sure you discuss any questions you have with your health care provider.  DASH Eating Plan DASH stands for "Dietary Approaches to Stop Hypertension." The DASH eating plan is a healthy eating plan that has been shown to reduce high blood pressure (hypertension). Additional health benefits may include reducing the risk of type 2 diabetes mellitus, heart disease, and stroke. The DASH eating plan may also help with weight loss. WHAT DO I NEED TO KNOW ABOUT THE DASH EATING PLAN? For the DASH eating plan, you will follow these general guidelines:  Choose foods with a percent daily value for sodium of less than 5% (as listed on the food label).  Use salt-free seasonings or herbs instead of table salt or sea salt.  Check  with your health care provider or pharmacist before using salt substitutes.  Eat lower-sodium products, often labeled as "lower sodium" or "no salt added."  Eat fresh foods.  Eat more vegetables, fruits, and low-fat dairy products.  Choose whole grains. Look for the word "whole" as the first word in the ingredient list.  Choose fish and skinless chicken or [place] more often than red meat. Limit fish, poultry, and meat to 6 oz (170 g) each day.  Limit sweets, desserts, sugars, and sugary drinks.  Choose heart-healthy fats.  Limit cheese to 1 oz (28 g) per day.  Eat more home-cooked food and less restaurant, buffet, and fast food.  Limit fried foods.  Cook foods using methods other than frying.  Limit canned vegetables. If you do use them, rinse them well to decrease the sodium.  When eating at a restaurant, ask that your food be prepared with less salt, or no salt if possible. WHAT FOODS CAN I EAT? Seek help from a dietitian for individual calorie needs. Grains Whole grain or whole wheat bread. Brown rice. Whole grain or whole wheat pasta. Quinoa, bulgur, and whole grain cereals. Low-sodium cereals. Corn or whole wheat flour tortillas. Whole grain cornbread. Whole grain crackers. Low-sodium crackers. Vegetables Fresh or frozen vegetables (raw, steamed, roasted, or grilled). Low-sodium or reduced-sodium tomato and vegetable juices. Low-sodium  or reduced-sodium tomato sauce and paste. Low-sodium or reduced-sodium canned vegetables.  Fruits All fresh, canned (in natural juice), or frozen fruits. Meat and Other Protein Products Ground beef (85% or leaner), grass-fed beef, or beef trimmed of fat. Skinless chicken or [place]. Ground chicken or [place]. Pork trimmed of fat. All fish and seafood. Eggs. Dried beans, peas, or lentils. Unsalted nuts and seeds. Unsalted canned beans. Dairy Low-fat dairy products, such as skim or 1% milk, 2% or reduced-fat cheeses, low-fat ricotta or cottage  cheese, or plain low-fat yogurt. Low-sodium or reduced-sodium cheeses. Fats and Oils Tub margarines without trans fats. Light or reduced-fat mayonnaise and salad dressings (reduced sodium). Avocado. Safflower, olive, or canola oils. Natural peanut or almond butter. Other Unsalted popcorn and pretzels. The items listed above may not be a complete list of recommended foods or beverages. Contact your dietitian for more options. WHAT FOODS ARE NOT RECOMMENDED? Grains White bread. White pasta. White rice. Refined cornbread. Bagels and croissants. Crackers that contain trans fat. Vegetables Creamed or fried vegetables. Vegetables in a cheese sauce. Regular canned vegetables. Regular canned tomato sauce and paste. Regular tomato and vegetable juices. Fruits Dried fruits. Canned fruit in light or heavy syrup. Fruit juice. Meat and Other Protein Products Fatty cuts of meat. Ribs, chicken wings, bacon, sausage, bologna, salami, chitterlings, fatback, hot dogs, bratwurst, and packaged luncheon meats. Salted nuts and seeds. Canned beans with salt. Dairy Whole or 2% milk, cream, half-and-half, and cream cheese. Whole-fat or sweetened yogurt. Full-fat cheeses or blue cheese. Nondairy creamers and whipped toppings. Processed cheese, cheese spreads, or cheese curds. Condiments Onion and garlic salt, seasoned salt, table salt, and sea salt. Canned and packaged gravies. Worcestershire sauce. Tartar sauce. Barbecue sauce. Teriyaki sauce. Soy sauce, including reduced sodium. Steak sauce. Fish sauce. Oyster sauce. Cocktail sauce. Horseradish. Ketchup and mustard. Meat flavorings and tenderizers. Bouillon cubes. Hot sauce. Tabasco sauce. Marinades. Taco seasonings. Relishes. Fats and Oils Butter, stick margarine, lard, shortening, ghee, and bacon fat. Coconut, palm kernel, or palm oils. Regular salad dressings. Other Pickles and olives. Salted popcorn and pretzels. The items listed above may not be a complete list  of foods and beverages to avoid. Contact your dietitian for more information. WHERE CAN I FIND MORE INFORMATION? National Heart, Lung, and Blood Institute: [employer] Document Released: 01/19/2011 Document Revised: 06/16/2013 Document Reviewed: 12/04/2012 [facility] Patient Information 2015 [place], [place]. This information is not intended to replace advice given to you by your health care provider. Make sure you discuss any questions you have with your health care provider.   Emergency Department Resource Guide 1) Find a Doctor and Pay Out of Pocket Although you won't have to find out who is covered by your insurance plan, it is a good idea to ask around and get recommendations. You will then need to call the office and see if the doctor you have chosen will accept you as a new patient and what types of options they offer for patients who are self-pay. Some doctors offer discounts or will set up payment plans for their patients who do not have insurance, but you will need to ask so you aren't surprised when you get to your appointment.  2) Contact Your Local Health Department Not all health departments have doctors that can see patients for sick visits, but many do, so it is worth a call to see if yours does. If you don't know where your local health department is, you can check in your phone book. The CDC also has  a tool to help you locate your state's health department, and many state websites also have listings of all of their local health departments.  3) Find a Walk-in Clinic If your illness is not likely to be very severe or complicated, you may want to try a walk in clinic. These are popping up all over the country in pharmacies, drugstores, and shopping centers. They're usually staffed by nurse practitioners or physician assistants that have been trained to treat common illnesses and complaints. They're usually fairly quick and inexpensive. However, if  you have serious medical issues or chronic medical problems, these are probably not your best option.  No Primary Care Doctor: - Call Health Connect at  [phone] - they can help you locate a primary care doctor that  accepts your insurance, provides certain services, etc. - Physician Referral Service- [phone]  Chronic Pain Problems: Organization         Address  Phone   Notes  [facility]  [phone] Patients need to be referred by their primary care doctor.   Medication Assistance: Organization         Address  Phone   Notes  [facility] Medication [facility] [address] [place] [place], [place] [address] [phone] --Must be a resident of [facility] -- Must have NO insurance coverage whatsoever (no Medicaid/ Medicare, etc.) -- The pt. MUST have a primary care doctor that directs their care regularly and follows them in the community   [facility]ist  [phone]   [employer]  [phone]    Agencies that provide inexpensive medical care: Organization         Address  Phone   Notes  [facility]  [phone]   [facility]    [phone]   [facility] [address] [place], [place] [address] [phone]   [facility] [address] [place] [phone]   Planned Parenthood    [phone]   [facility]    [phone]   [facility]  [address], [place] Phone:  [phone], Fax:  [phone] Hours of Operation:  9 am - 6 pm, M-F.  Also accepts Medicaid/Medicare and self-pay.  [facility]  [address], [place] Phone: [phone], Fax: [phone]. Hours of Operation:  8:30 am - 5:30 pm, M-F.  Also accepts Medicaid and self-pay.  [facility] [place] [address], [place] Phone: [phone]   [facility] [address] Natasha Bence [place], [place] [phone], Ext. [phone] Mondays & Thursdays: 7-9 AM.  First 15 patients are seen on a first come, first serve basis.    Medicaid-accepting [facility] Providers:  Organization         Address  Phone   Notes  [facility] [address], [place] [phone] Also accepts self-pay patients.  [facility] [address] Laurell Josephs [place], [place]  [phone]   [facility] [address], [place] [phone]   [facility] [address] [place] [phone]   Renaye Rakers [address], [place]   [phone] Only accepts [place] patients after they have their name applied to their card.   Self-Pay (no insurance) in [place]:  Organization         Address  Phone   Notes  Sickle Cell Patients, [facility] [address] [place], [place] [phone]   [facility] [address] [place], [place] [phone]   [facility] [place]  [address], [place] [phone]   [facility]/Dr. Osei-Bonsu  [address] Home or [address], [place] [phone] Phone number for both [place] and [place] locations is the same.  [facility] [address] [place] [phone]   [facility] [address], [place] or [address] [phone]   [facility] [address] [place] [phone], phone; [phone], fax Sees patients 1st and 3rd Saturday of every month.  Must not qualify for public or private insurance (i.e. Medicaid, Medicare, [place] Health Choice, Veterans' Benefits)  Household income should be no more than 200% of the poverty level The clinic cannot treat you if you are pregnant or think you are  pregnant  Sexually transmitted diseases are not treated at the clinic.    Dental Care: Organization         Address  Phone  Notes  [facility] Department of [facility] [address] [place], [place] [phone] Accepts children up to age 78 who are enrolled in [place] or [place] Health Choice; pregnant women with a Medicaid card; and children who have applied for Medicaid or [place] Health Choice, but were declined, whose parents can pay a reduced fee at time of service.  [facility] Department of [facility]  [address], [place] [phone] Accepts children up to age 74 who are enrolled in [place] or [place] Health Choice; pregnant women with a Medicaid card; and children who have applied for Medicaid or [place] Health Choice, but were declined, whose parents can pay a reduced fee at time of service.  [facility]  [address] [place], [place] [phone] Patients are seen by appointment only. Walk-ins are not accepted. [facility] will see patients 45 years of age and older. Monday - Tuesday (8am-5pm) Most Wednesdays (8:30-5pm) $30 per visit, cash only  [facility] Adult Dental Access PRO[facility]  [address], [facility] [phone] Patients are seen by appointment only. Walk-ins are not accepted. [facility] will see patients 24 years of age and older. One Wednesday Evening (Monthly: Volunteer Based).  $30 per visit, cash only  [employer]  [phone] for adults; Children under age 25, call Graduate Pediatric Dentistry at [phone]. Children aged 53-14, please call [phone] to request a pediatric application.  Dental services are provided in all areas of dental care including fillings, crowns and bridges, complete and partial dentures, implants, gum treatment, root canals, and extractions. Preventive care is also provided. Treatment is provided to both adults and  children. Patients are selected via a lottery and there is often a waiting list.   [facility] [address] [place]  [phone] [email or web address]   [facility] [address] [place], [place] [phone], Ext. [phone] Second and Fourth Thursday of each month, opens at 6:30 AM; Clinic ends at 9 AM.  Patients are seen on a first-come first-served basis, and a limited number are seen during each clinic.  [facility]  [address], [place], [place] [phone]   Eligibility Requirements You must have lived in [place], [place], or [place] counties for at least the last three months.   You cannot be eligible for state or federal sponsored [place]healthcare insurance, including CIG[employer]Veterans Administration, [place]icaid, or [employer]icare.   You generally cannot be eligible for healthcare insurance through your employer.    How to apply: Eligibility screenings are held every Tuesday and Wednesday afternoon from 1:00 pm until 4:00 pm. You do not need an appointment for the interview!  [facility] [address], [place], [place] [phone]   [facility]  [phone]   [facility]  [phone]   [facility]  [phone]    Behavioral Health Resources in the Community: Intensive Outpatient Programs Organization         Address  Phone  Notes  [facility] [address], [place], [place] [phone]   [facility] Out[facility] [address], [place], [place] [phone]   [facility] [address], [place], [place]  [phone]   [facility] [address],  [place], [place] [phone] or [phone]   Substance Abuse Resources Organization         Address  Phone  Notes  [facility]  [phone]   [facility]  [phone]   [facility]  [phone]    [facility]  [phone]   Residential & Outpatient Substance Abuse Program  [phone]   Psychological Services Organization         Address  Phone  Notes  [facility]  [phone]- [phone]   [facility]  [phone]- [phone]   [facility] [address], [place] [phone] or [phone]    Mobile Crisis Teams Organization         Address  Phone  Notes  [facility], Mobile Crisis Care Unit  [phone]   [facility]  [address] [place], [place] [phone]   Doristine LocksSharon DeEsch [address] [place] [phone]    Self-Help/Support Groups Organization         Address  Phone             Notes  [facility] [place] - variety of support groups  [phone] Call for more information  Narcotics Anonymous (NA), [facility] [address], [place]  2 meetings at this location   Statisticianesidential Treatment Programs Organization         Address  Phone  Notes  [facility] [address],    [place]  [phone]   [facility]  [address], [place] [phone], [place], [place] [phone]   [facility] [address], [place] [phone] Admissions: 8am-3pm M-F  [facility] [address]    [place], [place] [phone]   [facility] [address], [place], [place] [phone]   [facility] [address]  [place], [place] [phone]   [facility] - Intensive Outpatient [address], [place], [place] [phone]   [facility] ([facility] [address]  [place], [place] [phone] or [phone]   [facility] ([facility]) [address] [place], [place] [phone] Accepts Medicaid  [facility] [address]  [place] [phone] Substance Abuse/Addiction Treatment  [facility] Organization         Address  Phone  Notes  [facility]  [phone]   Angie Fava, PhD [address] Ervin Knack [place], [place]   [phone] or [phone]   [facility] Behavioral   [address] [place], [place] [phone]   [facility] [address] [place], [place] [phone] Insurance/Medicaid/sponsorship through [facility] Families [address]                                    [place], [place] [phone] Therapy/tele-psych/case  [facility] [address], [place] [phone]    Dr. Lolly Mustache  [phone]   [facility]  [employer] [facility] Dept. 1) [address] [place] 2) [address], [place] 3)  [address], [place] [phone]  [phone]   [facility] Child Abuse Hotline [phone] or [phone] (After Hours)

## 2013-09-23 NOTE — ED Notes (Signed)
PT denies any CP. 

## 2013-09-25 NOTE — ED Provider Notes (Signed)
Medical screening examination/treatment/procedure(s) were performed by non-physician practitioner and as supervising physician I was immediately available for consultation/collaboration.   EKG Interpretation   Date/Time:  Tuesday September 23 2013 20:48:04 EDT Ventricular Rate:  60 PR Interval:  178 QRS Duration: 98 QT Interval:  398 QTC Calculation: 398 R Axis:   59 Text Interpretation:  Normal sinus rhythm with sinus arrhythmia Normal ECG  No old tracing to compare Confirmed by [facility] LLCKELLY  MD, THOMAS ([id number]) on  09/24/2013 6:13:19 PM       Raeford RazorStephen Arien Morine, MD 09/25/13 [id number]

## 2013-11-21 ENCOUNTER — Encounter (HOSPITAL_BASED_OUTPATIENT_CLINIC_OR_DEPARTMENT_OTHER): Payer: Self-pay | Admitting: Emergency Medicine

## 2013-11-21 ENCOUNTER — Emergency Department (HOSPITAL_BASED_OUTPATIENT_CLINIC_OR_DEPARTMENT_OTHER)
Admission: EM | Admit: 2013-11-21 | Discharge: 2013-11-21 | Disposition: A | Discharge status: Home / Self Care | Payer: Self-pay | Attending: Emergency Medicine | Admitting: Emergency Medicine

## 2013-11-21 DIAGNOSIS — Z79899 Other long term (current) drug therapy: Secondary | ICD-10-CM | POA: Insufficient documentation

## 2013-11-21 DIAGNOSIS — Z720 Tobacco use: Secondary | ICD-10-CM | POA: Insufficient documentation

## 2013-11-21 DIAGNOSIS — I10 Essential (primary) hypertension: Secondary | ICD-10-CM | POA: Insufficient documentation

## 2013-11-21 LAB — BASIC METABOLIC PANEL
Anion gap: 13 (ref 5–15)
BUN: 13 mg/dL (ref 6–23)
CO2: 25 mEq/L (ref 19–32)
CREATININE: 1.3 mg/dL (ref 0.50–1.35)
Calcium: 9.6 mg/dL (ref 8.4–10.5)
Chloride: 103 mEq/L (ref 96–112)
GFR calc Af Amer: 87 mL/min — ABNORMAL LOW (ref >90)
GFR, EST NON AFRICAN AMERICAN: 75 mL/min — AB (ref >90)
GLUCOSE: 91 mg/dL (ref 70–99)
POTASSIUM: 4.1 meq/L (ref 3.7–5.3)
Sodium: 141 mEq/L (ref 137–147)

## 2013-11-21 MED ORDER — HYDROCHLOROTHIAZIDE 25 MG PO TABS
25.0000 mg | ORAL_TABLET | Freq: Once | ORAL | Status: AC
  Administered 2013-11-21: 25 mg via ORAL
  Filled 2013-11-21: qty 1

## 2013-11-21 MED ORDER — LISINOPRIL-HYDROCHLOROTHIAZIDE 10-12.5 MG PO TABS: 1.0000 | ORAL_TABLET | Freq: Every day | ORAL | Status: AC

## 2013-11-21 MED ORDER — LISINOPRIL-HYDROCHLOROTHIAZIDE 10-12.5 MG PO TABS: 1.0000 | ORAL_TABLET | Freq: Every day | ORAL | Status: DC

## 2013-11-21 NOTE — ED Notes (Signed)
Headache. States he ran out of his BP medication a few days ago.

## 2013-11-21 NOTE — Discharge Instructions (Signed)
DASH Eating Plan DASH stands for "Dietary Approaches to Stop Hypertension." The DASH eating plan is a healthy eating plan that has been shown to reduce high blood pressure (hypertension). Additional health benefits may include reducing the risk of type 2 diabetes mellitus, heart disease, and stroke. The DASH eating plan may also help with weight loss. WHAT DO I NEED TO KNOW ABOUT THE DASH EATING PLAN? For the DASH eating plan, you will follow these general guidelines:  Choose foods with a percent daily value for sodium of less than 5% (as listed on the food label).  Use salt-free seasonings or herbs instead of table salt or sea salt.  Check with your health care provider or pharmacist before using salt substitutes.  Eat lower-sodium products, often labeled as "lower sodium" or "no salt added."  Eat fresh foods.  Eat more vegetables, fruits, and low-fat dairy products.  Choose whole grains. Look for the word "whole" as the first word in the ingredient list.  Choose fish and skinless chicken or turkey more often than red meat. Limit fish, poultry, and meat to 6 oz (170 g) each day.  Limit sweets, desserts, sugars, and sugary drinks.  Choose heart-healthy fats.  Limit cheese to 1 oz (28 g) per day.  Eat more home-cooked food and less restaurant, buffet, and fast food.  Limit fried foods.  Cook foods using methods other than frying.  Limit canned vegetables. If you do use them, rinse them well to decrease the sodium.  When eating at a restaurant, ask that your food be prepared with less salt, or no salt if possible. WHAT FOODS CAN I EAT? Seek help from a dietitian for individual calorie needs. Grains Whole grain or whole wheat bread. Brown rice. Whole grain or whole wheat pasta. Quinoa, bulgur, and whole grain cereals. Low-sodium cereals. Corn or whole wheat flour tortillas. Whole grain cornbread. Whole grain crackers. Low-sodium crackers. Vegetables Fresh or frozen vegetables  (raw, steamed, roasted, or grilled). Low-sodium or reduced-sodium tomato and vegetable juices. Low-sodium or reduced-sodium tomato sauce and paste. Low-sodium or reduced-sodium canned vegetables.  Fruits All fresh, canned (in natural juice), or frozen fruits. Meat and Other Protein Products Ground beef (85% or leaner), grass-fed beef, or beef trimmed of fat. Skinless chicken or turkey. Ground chicken or turkey. Pork trimmed of fat. All fish and seafood. Eggs. Dried beans, peas, or lentils. Unsalted nuts and seeds. Unsalted canned beans. Dairy Low-fat dairy products, such as skim or 1% milk, 2% or reduced-fat cheeses, low-fat ricotta or cottage cheese, or plain low-fat yogurt. Low-sodium or reduced-sodium cheeses. Fats and Oils Tub margarines without trans fats. Light or reduced-fat mayonnaise and salad dressings (reduced sodium). Avocado. Safflower, olive, or canola oils. Natural peanut or almond butter. Other Unsalted popcorn and pretzels. The items listed above may not be a complete list of recommended foods or beverages. Contact your dietitian for more options. WHAT FOODS ARE NOT RECOMMENDED? Grains White bread. White pasta. White rice. Refined cornbread. Bagels and croissants. Crackers that contain trans fat. Vegetables Creamed or fried vegetables. Vegetables in a cheese sauce. Regular canned vegetables. Regular canned tomato sauce and paste. Regular tomato and vegetable juices. Fruits Dried fruits. Canned fruit in light or heavy syrup. Fruit juice. Meat and Other Protein Products Fatty cuts of meat. Ribs, chicken wings, bacon, sausage, bologna, salami, chitterlings, fatback, hot dogs, bratwurst, and packaged luncheon meats. Salted nuts and seeds. Canned beans with salt. Dairy Whole or 2% milk, cream, half-and-half, and cream cheese. Whole-fat or sweetened yogurt. Full-fat   cheeses or blue cheese. Nondairy creamers and whipped toppings. Processed cheese, cheese spreads, or cheese  curds. Condiments Onion and garlic salt, seasoned salt, table salt, and sea salt. Canned and packaged gravies. Worcestershire sauce. Tartar sauce. Barbecue sauce. Teriyaki sauce. Soy sauce, including reduced sodium. Steak sauce. Fish sauce. Oyster sauce. Cocktail sauce. Horseradish. Ketchup and mustard. Meat flavorings and tenderizers. Bouillon cubes. Hot sauce. Tabasco sauce. Marinades. Taco seasonings. Relishes. Fats and Oils Butter, stick margarine, lard, shortening, ghee, and bacon fat. Coconut, palm kernel, or palm oils. Regular salad dressings. Other Pickles and olives. Salted popcorn and pretzels. The items listed above may not be a complete list of foods and beverages to avoid. Contact your dietitian for more information. WHERE CAN I FIND MORE INFORMATION? National Heart, Lung, and Blood Institute: www.nhlbi.nih.gov/health/health-topics/topics/dash/ Document Released: 01/19/2011 Document Revised: 06/16/2013 Document Reviewed: 12/04/2012 ExitCare Patient Information 2015 [employer]. This information is not intended to replace advice given to you by your health care provider. Make sure you discuss any questions you have with your health care provider. Hypertension Hypertension, commonly called high blood pressure, is when the force of blood pumping through your arteries is too strong. Your arteries are the blood vessels that carry blood from your heart throughout your body. A blood pressure reading consists of a higher number over a lower number, such as 110/72. The higher number (systolic) is the pressure inside your arteries when your heart pumps. The lower number (diastolic) is the pressure inside your arteries when your heart relaxes. Ideally you want your blood pressure below 120/80. Hypertension forces your heart to work harder to pump blood. Your arteries may become narrow or stiff. Having hypertension puts you at risk for heart disease, stroke, and other problems.  RISK  FACTORS Some risk factors for high blood pressure are controllable. Others are not.  Risk factors you cannot control include:   Race. You may be at higher risk if you are African American.  Age. Risk increases with age.  Gender. Men are at higher risk than women before age 45 years. After age 65, women are at higher risk than men. Risk factors you can control include:  Not getting enough exercise or physical activity.  Being overweight.  Getting too much fat, sugar, calories, or salt in your diet.  Drinking too much alcohol. SIGNS AND SYMPTOMS Hypertension does not usually cause signs or symptoms. Extremely high blood pressure (hypertensive crisis) may cause headache, anxiety, shortness of breath, and nosebleed. DIAGNOSIS  To check if you have hypertension, your health care provider will measure your blood pressure while you are seated, with your arm held at the level of your heart. It should be measured at least twice using the same arm. Certain conditions can cause a difference in blood pressure between your right and left arms. A blood pressure reading that is higher than normal on one occasion does not mean that you need treatment. If one blood pressure reading is high, ask your health care provider about having it checked again. TREATMENT  Treating high blood pressure includes making lifestyle changes and possibly taking medicine. Living a healthy lifestyle can help lower high blood pressure. You may need to change some of your habits. Lifestyle changes may include:  Following the DASH diet. This diet is high in fruits, vegetables, and whole grains. It is low in salt, red meat, and added sugars.  Getting at least 2 hours of brisk physical activity every week.  Losing weight if necessary.  Not smoking.  Limiting   alcoholic beverages.  Learning ways to reduce stress. If lifestyle changes are not enough to get your blood pressure under control, your health care provider may  prescribe medicine. You may need to take more than one. Work closely with your health care provider to understand the risks and benefits. HOME CARE INSTRUCTIONS  Have your blood pressure rechecked as directed by your health care provider.   Take medicines only as directed by your health care provider. Follow the directions carefully. Blood pressure medicines must be taken as prescribed. The medicine does not work as well when you skip doses. Skipping doses also puts you at risk for problems.   Do not smoke.   Monitor your blood pressure at home as directed by your health care provider. SEEK MEDICAL CARE IF:   You think you are having a reaction to medicines taken.  You have recurrent headaches or feel dizzy.  You have swelling in your ankles.  You have trouble with your vision. SEEK IMMEDIATE MEDICAL CARE IF:  You develop a severe headache or confusion.  You have unusual weakness, numbness, or feel faint.  You have severe chest or abdominal pain.  You vomit repeatedly.  You have trouble breathing. MAKE SURE YOU:   Understand these instructions.  Will watch your condition.  Will get help right away if you are not doing well or get worse. Document Released: 01/30/2005 Document Revised: 06/16/2013 Document Reviewed: 11/22/2012 ExitCare Patient Information 2015 [employer]. This information is not intended to replace advice given to you by your health care provider. Make sure you discuss any questions you have with your health care provider.  

## 2013-11-21 NOTE — ED Provider Notes (Signed)
CSN: [id number]     Arrival date & time 11/21/13  1851 History   First MD Initiated Contact with Patient 11/21/13 1957     Chief Complaint  Patient presents with  . Headache     (Consider location/radiation/quality/duration/timing/severity/associated sxs/prior Treatment) Patient is a 23 y.o. male presenting with hypertension. The history is provided by the patient. No language interpreter was used.  Hypertension This is a new problem. The current episode started 1 to 4 weeks ago. The problem occurs constantly. The problem has been gradually worsening. Associated symptoms include headaches. Nothing aggravates the symptoms. He has tried nothing for the symptoms. The treatment provided moderate relief.    Past Medical History  Diagnosis Date  . Hypertension    History reviewed. No pertinent past surgical history. No family history on file. History  Substance Use Topics  . Smoking status: Current Every Day Smoker -- 1.00 packs/day  . Smokeless tobacco: Not on file  . Alcohol Use: 2.4 oz/week    4 Cans of beer per week    Review of Systems  Neurological: Positive for headaches.  All other systems reviewed and are negative.     Allergies  Review of patient's allergies indicates no known allergies.  Home Medications   Prior to Admission medications   Medication Sig Start Date End Date Taking? Authorizing Provider  hydrochlorothiazide (HYDRODIURIL) 12.5 MG tablet Take 12.5mg  PO daily for 7 days, then take 25mg  PO daily 07/10/13   Renne CriglerJoshua Geiple, PA-C  hydrochlorothiazide (HYDRODIURIL) 25 MG tablet Take 0.5 tablets (12.5 mg total) by mouth daily. 09/23/13   Marissa Sciacca, PA-C   BP 163/105  Pulse 83  Temp(Src) 99.3 F (37.4 C) (Oral)  Resp 20  Ht 5\' 8"  (1.727 m)  Wt 311 lb (141.069 kg)  BMI 47.30 kg/m2  SpO2 99% Physical Exam  Nursing note and vitals reviewed. Constitutional: He is oriented to person, place, and time. He appears well-developed and well-nourished.   HENT:  Head: Normocephalic and atraumatic.  Eyes: EOM are normal. Pupils are equal, round, and reactive to light.  Neck: Normal range of motion.  Cardiovascular: Normal rate and normal heart sounds.   Pulmonary/Chest: Effort normal.  Abdominal: Soft. He exhibits no distension.  Musculoskeletal: Normal range of motion.  Neurological: He is alert and oriented to person, place, and time.  Skin: Skin is warm.  Psychiatric: He has a normal mood and affect.    ED Course  Procedures (including critical care time) Labs Review Labs Reviewed - No data to display  Imaging Review No results found.   EKG Interpretation None    noral ekg, normal sinus 88 normal qrs,   MDM   Final diagnoses:  Essential hypertension    Lisinopril/hctz.  Follow up [facility] for recheck    Elson AreasLeslie K Kazandra Forstrom, PA-C 11/21/13 2157

## 2013-11-24 NOTE — ED Provider Notes (Signed)
History/physical exam/procedure(s) were performed by non-physician practitioner and as supervising physician I was immediately available for consultation/collaboration. I have reviewed all notes and am in agreement with care and plan.   Hilario Quarryanielle S Rayette Mogg, MD 11/24/13 [phone]

## 2016-05-28 IMAGING — CR DG CHEST 2V
2 series · 2 of 2 positions shown · non-contrast
Comparison: Chest X ray dated January 08, 2011

CLINICAL DATA: Hypertensive episode ; discontinued antihypertensive

EXAM:
CHEST  2 VIEW

[w chest pa]
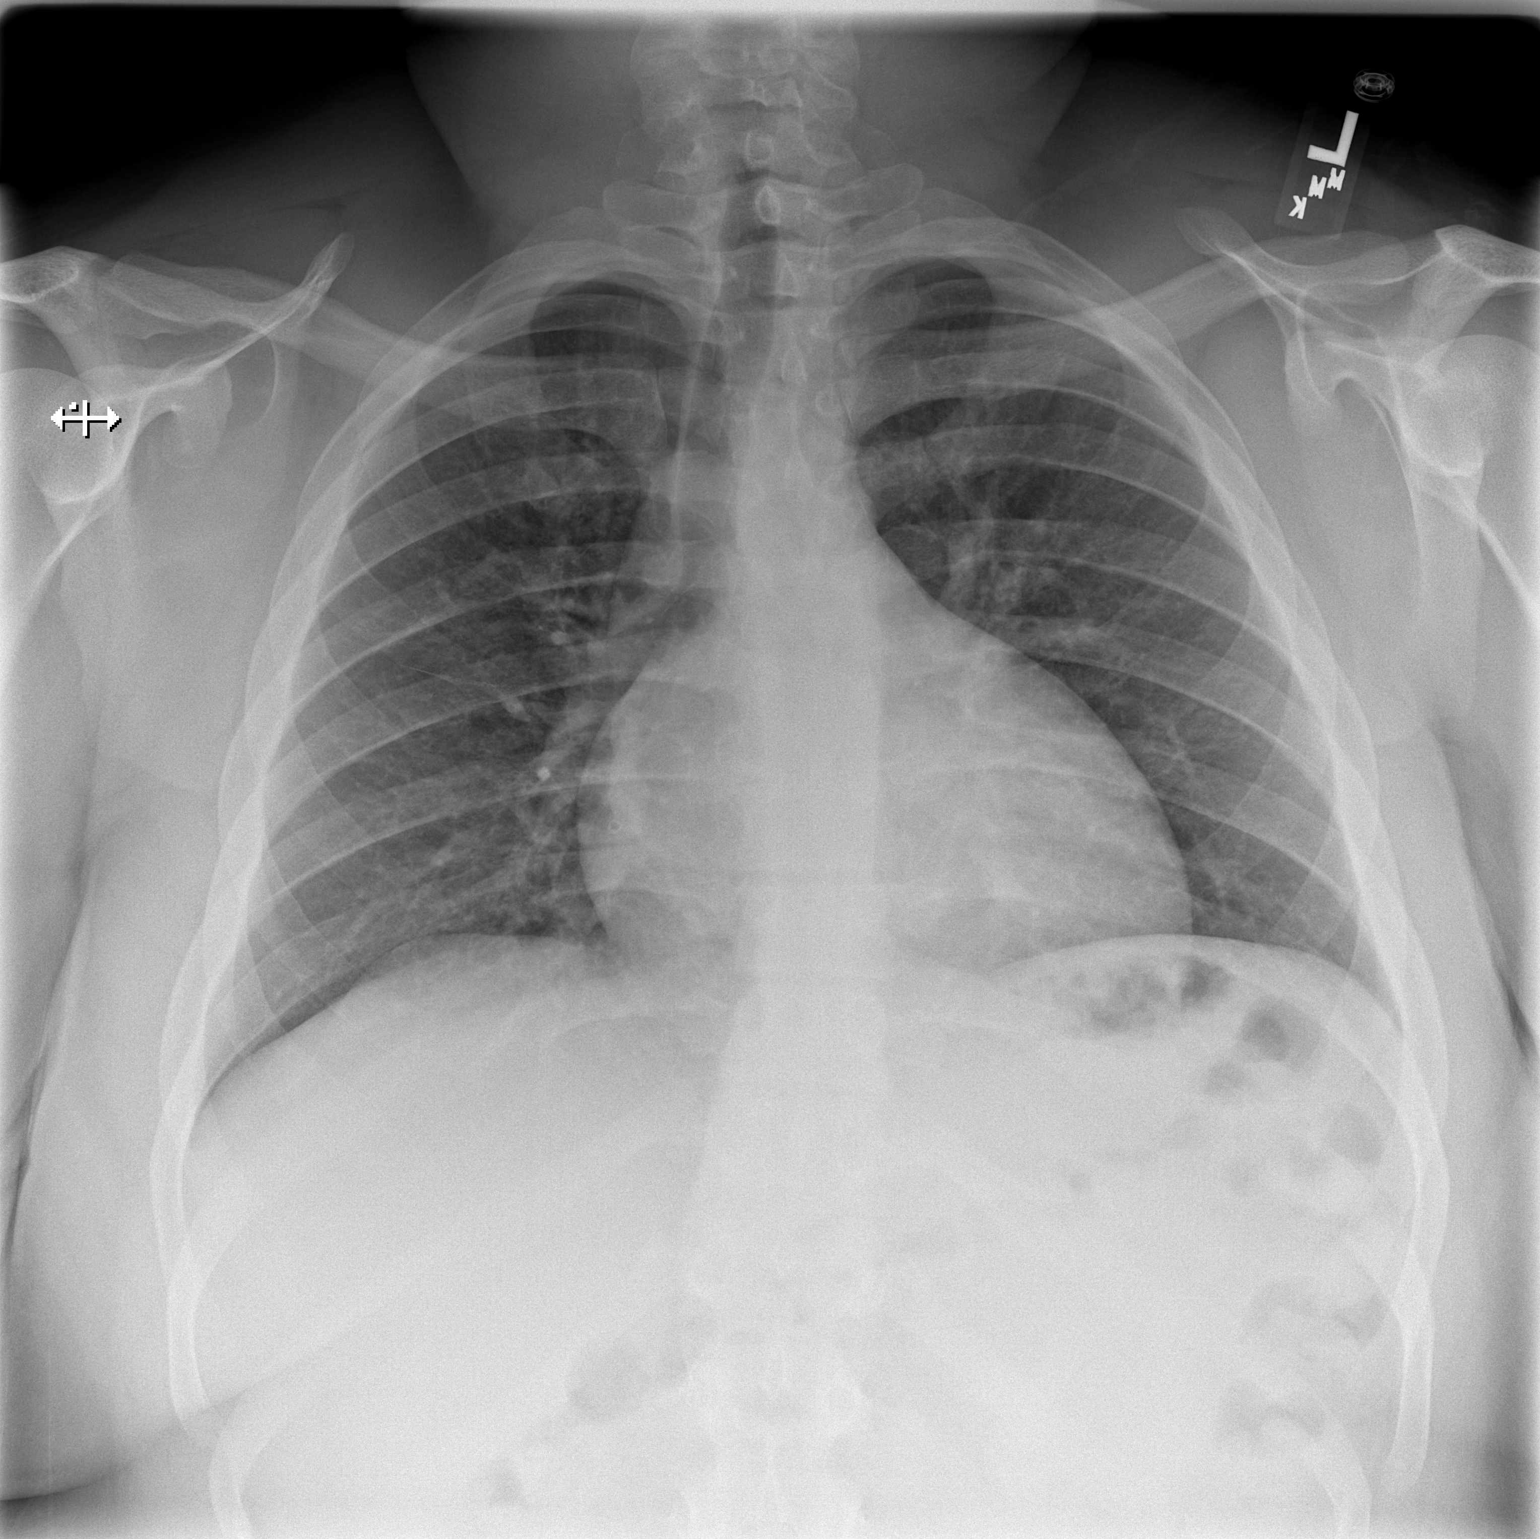

[w chest lat]
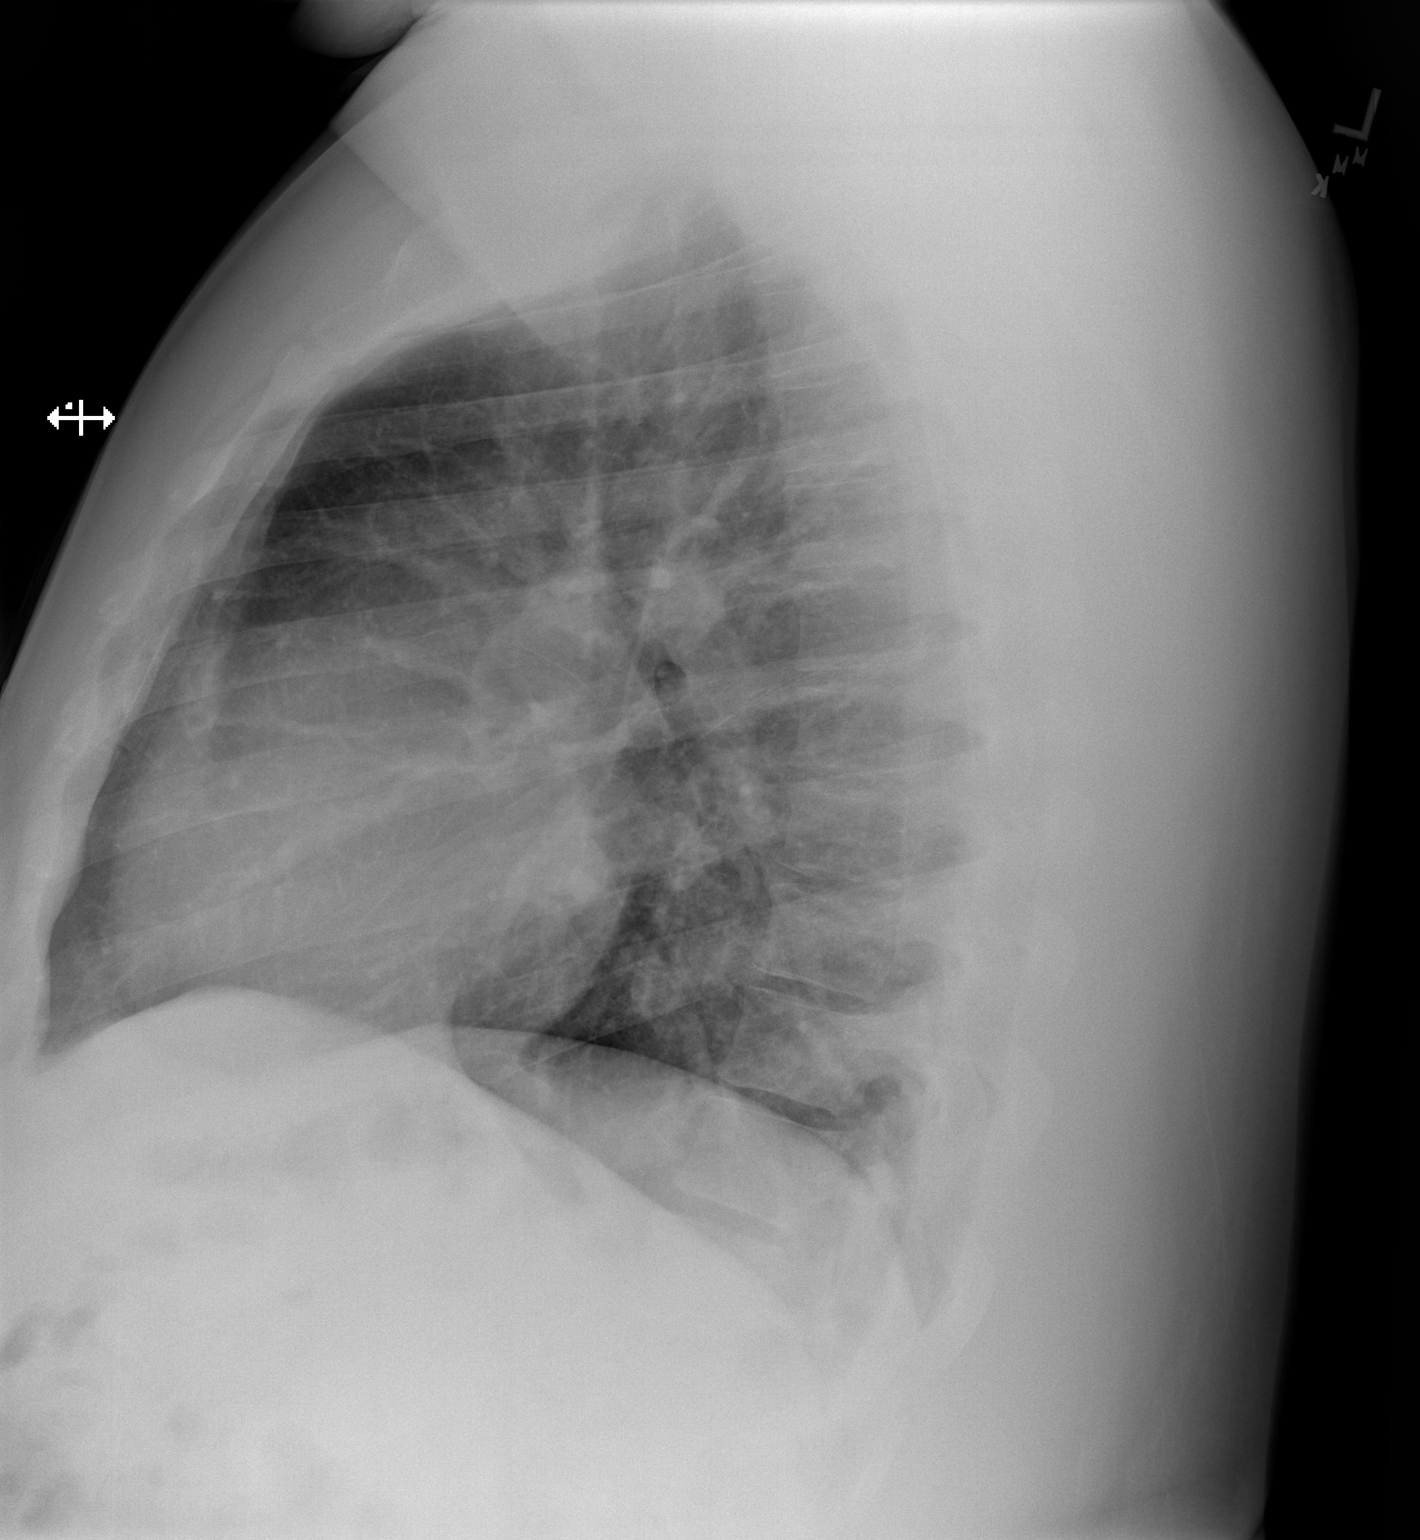

[2 of 2 positions shown; findings below may reference images not displayed]

FINDINGS: The lungs are adequately inflated. There is no focal infiltrate. The
cardiac silhouette is top-normal in size. The central pulmonary
vascularity is prominent but not greatly changed. There is no
pleural effusion. The bony thorax exhibits no acute abnormality.
IMPRESSION: There is no evidence of acute cardiopulmonary abnormality.
